# Patient Record
Sex: Female | Born: 1995 | Race: Black or African American | Hispanic: No | Marital: Single | Smoking: Never smoker
Health system: Southern US, Community
[De-identification: ages and names within clinical notes are randomized; demographics above are authoritative.]

---
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
## 2015-11-01 ENCOUNTER — Encounter (HOSPITAL_COMMUNITY): Payer: Self-pay | Admitting: Family Medicine

## 2015-11-01 ENCOUNTER — Emergency Department (HOSPITAL_COMMUNITY)
Admission: EM | Admit: 2015-11-01 | Discharge: 2015-11-01 | Disposition: A | Discharge status: Home / Self Care | Payer: 59 | Attending: Emergency Medicine | Admitting: Emergency Medicine

## 2015-11-01 ENCOUNTER — Emergency Department (HOSPITAL_COMMUNITY): Payer: 59

## 2015-11-01 DIAGNOSIS — R079 Chest pain, unspecified: Secondary | ICD-10-CM | POA: Diagnosis present

## 2015-11-01 DIAGNOSIS — R0602 Shortness of breath: Secondary | ICD-10-CM | POA: Insufficient documentation

## 2015-11-01 DIAGNOSIS — R0789 Other chest pain: Secondary | ICD-10-CM | POA: Insufficient documentation

## 2015-11-01 DIAGNOSIS — R011 Cardiac murmur, unspecified: Secondary | ICD-10-CM

## 2015-11-01 DIAGNOSIS — Z79899 Other long term (current) drug therapy: Secondary | ICD-10-CM | POA: Insufficient documentation

## 2015-11-01 LAB — BASIC METABOLIC PANEL
Anion gap: 10 (ref 5–15)
BUN: 6 mg/dL (ref 6–20)
CHLORIDE: 108 mmol/L (ref 101–111)
CO2: 23 mmol/L (ref 22–32)
CREATININE: 0.72 mg/dL (ref 0.44–1.00)
Calcium: 9 mg/dL (ref 8.9–10.3)
GFR calc non Af Amer: >60 mL/min (ref >60)
GLUCOSE: 98 mg/dL (ref 65–99)
Potassium: 3.5 mmol/L (ref 3.5–5.1)
Sodium: 141 mmol/L (ref 135–145)

## 2015-11-01 LAB — CBC
HCT: 37.1 % (ref 36.0–46.0)
Hemoglobin: 12.1 g/dL (ref 12.0–15.0)
MCH: 29.7 pg (ref 26.0–34.0)
MCHC: 32.6 g/dL (ref 30.0–36.0)
MCV: 91.2 fL (ref 78.0–100.0)
PLATELETS: 303 10*3/uL (ref 150–400)
RBC: 4.07 MIL/uL (ref 3.87–5.11)
RDW: 12.4 % (ref 11.5–15.5)
WBC: 5.5 10*3/uL (ref 4.0–10.5)

## 2015-11-01 LAB — I-STAT TROPONIN, ED
Troponin i, poc: 0 ng/mL (ref 0.00–0.08)
Troponin i, poc: 0 ng/mL (ref 0.00–0.08)

## 2015-11-01 NOTE — ED Notes (Signed)
Pt left with all her belongings and ambulated out of the treatment area.  

## 2015-11-01 NOTE — ED Notes (Signed)
Pt here for 2 weeks of intermittent chest pain that is burning. 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 sts nothing makes it worse. No heavy lifting. Denies cough, fever, sts some SOB,

## 2015-11-01 NOTE — Discharge Instructions (Signed)
Please call the cardiology clinic listed to schedule a follow-up appointment. Please let them know you were seen in the emergency department and it was recommended that you follow up with a cardiologist and get an echo as an outpatient. I would also advise you to find a primary physician in the area and follow up with them as well. Return to the ER for chest pain on exertion, difficulty breathing, new or worsening symptoms, any additional concerns.

## 2015-11-01 NOTE — ED Provider Notes (Signed)
CSN: [id number]     Arrival date & time 11/01/15  1541 History   First MD Initiated Contact with Patient 11/01/15 2053     Chief Complaint  Patient presents with  . Chest Pain     (Consider location/radiation/quality/duration/timing/severity/associated sxs/prior Treatment) Patient is a 20 y.o. female presenting with chest pain. The history is provided by the patient and medical records. No language interpreter was used.  Chest Pain Associated symptoms: shortness of breath   Associated symptoms: no abdominal pain, no back pain, no cough, no diaphoresis, no dizziness, no fever, no headache, no nausea, no palpitations and not vomiting    Kayla Leblanc is a 20 y.o. female  with no pertinent PMH who presents to the Emergency Department complaining of intermittent burning right-sided chest pain x 2 weeks. Patient states chest pain occurs without stimuli. Will occur "out of the blue" and is not related to exertion or eating. 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 Sometimes pain radiates into her stomach. Patient states during episodes she will sometimes experience shortness of breath, but other times will not. Denies fever, abdominal pain, n/v, back pain, increased activity level, injury or muscle strain, recent illness. At time of initial evaluation, chest pain has subsided and patient is asymptomatic.  Risk factors: Not a smoker, no cardiac history, no family cardiac history.  History reviewed. No pertinent past medical history. History reviewed. No pertinent past surgical history. History reviewed. No pertinent family history. Social History  Substance Use Topics  . Smoking status: Never Smoker   . Smokeless tobacco: None  . Alcohol Use: No   OB History    No data available     Review of Systems  Constitutional: Negative for fever, chills and diaphoresis.  HENT: Negative for congestion and sore throat.   Eyes: Negative for visual disturbance.  Respiratory: Positive for  shortness of breath. Negative for cough.   Cardiovascular: Positive for chest pain. Negative for palpitations and leg swelling.  Gastrointestinal: Negative for nausea, vomiting and abdominal pain.  Genitourinary: Negative for dysuria.  Musculoskeletal: Negative for back pain and neck pain.  Skin: Negative for rash.  Neurological: Negative for dizziness and headaches.      Allergies  Review of patient's allergies indicates no known allergies.  Home Medications   Prior to Admission medications   Medication Sig Start Date End Date Taking? Authorizing Provider  amoxicillin-clavulanate (AUGMENTIN) 500-125 MG per tablet Take 1 tablet (500 mg total) by mouth every 8 (eight) hours. 04/03/13   Marcellina Millin, MD  cephALEXin (KEFLEX) 500 MG capsule Take 1 capsule (500 mg total) by mouth 3 (three) times daily. 04/20/13   Reuben Likes, MD  mupirocin ointment (BACTROBAN) 2 % Apply 1 application topically 3 (three) times daily. 06/28/13   Reuben Likes, MD  phenazopyridine (PYRIDIUM) 200 MG tablet Take 1 tablet (200 mg total) by mouth 3 (three) times daily as needed for pain. 04/20/13   Reuben Likes, MD  sulfamethoxazole-trimethoprim (BACTRIM DS) 800-160 MG per tablet Take 2 tablets by mouth 2 (two) times daily. 06/28/13   Reuben Likes, MD   BP 124/86 mmHg  Pulse 60  Temp(Src) 98.3 F (36.8 C) (Oral)  Resp 19  SpO2 100%  LMP 10/25/2015 Physical Exam  Constitutional: She is oriented to person, place, and time. She appears well-developed and well-nourished.  Alert and in no acute distress  HENT:  Head: Normocephalic and atraumatic.  Cardiovascular: Normal rate, regular rhythm and intact distal pulses.  Exam reveals no gallop and no friction rub.   Murmur (Systolic 3/6) heard. Pulmonary/Chest: Effort normal and breath sounds normal. No respiratory distress. She has no wheezes. She has no rales. She exhibits tenderness.    Abdominal: Soft. Bowel sounds are normal. She exhibits no distension  and no mass. There is no tenderness. There is no rebound and no guarding.  Musculoskeletal: She exhibits no edema.  Neurological: She is alert and oriented to person, place, and time.  Skin: Skin is warm and dry.  Nursing note and vitals reviewed.   ED Course  Procedures (including critical care time) Labs Review Labs Reviewed  BASIC METABOLIC PANEL  CBC  I-STAT TROPOININ, ED  Rosezena SensorI-STAT TROPOININ, ED    Imaging Review Dg Chest 2 View  11/01/2015  CLINICAL DATA:  Chest pain for 2 weeks. EXAM: CHEST  2 VIEW COMPARISON:  None. FINDINGS: The heart size and mediastinal contours are within normal limits. Both lungs are clear. The visualized skeletal structures are unremarkable. IMPRESSION: No active cardiopulmonary disease. Electronically Signed   By: Elsie StainJohn T Curnes M.D.   On: 11/01/2015 16:33   I have personally reviewed and evaluated these images and lab results as part of my medical decision-making.   EKG Interpretation   Date/Time:  Wednesday November 01 2015 16:00:30 EDT Ventricular Rate:  71 PR Interval:  152 QRS Duration: 74 QT Interval:  392 QTC Calculation: 425 R Axis:   60 Text Interpretation:  Normal sinus rhythm with sinus arrhythmia Normal ECG  Confirmed by MESNER MD, Barbara CowerJASON [phone]([id number]) on 11/01/2015 9:57:15 PM      MDM   Final diagnoses:  Atypical chest pain  Heart murmur   Kayla Leblanc is a 20 y.o. female who presents to the ED for 2 weeks of intermittent chest pain described as burning. It is nonexertional. On exam, patient is tender to palpation over right side of chest which is where she states pain occurs. She also has a systolic heart murmur. Patient states she has never been told she has a heart murmur in the past. Upon my initial evaluation, patient states chest pain has resolved and she has been asymptomatic since arrival to the ED.  CBC, BMP, and troponin 2 obtained, reviewed, and within defined limits. Chest x-ray with no acute cardiopulmonary disease. EKG  reviewed and reassuring.  .Patient is to be discharged with recommendation to follow up with cardiology in regards to today's hospital visit. Given heart murmur on exam, most likely will need an echo done as an outpatient. His plan was discussed with patient who understands and agrees with the follow-up plan. Patient's symptoms unlikely to be of CAD etiology and patient has not reported any chest pain or shortness of breath while in my care. Labs and imaging reviewed again prior to dc. CXR and ECG with no acute abnormalities and neg troponins x2. Pt has been advised return to the ED if develop any exertional chest pain- strict return precautions discussed and all questions answered. Patient appears reliable for follow up and is agreeable to plan as dictated above.   Patient discussed with Dr. Clayborne DanaMesner who agrees with treatment plan.   [facility] HospitalJaime Pilcher Ward, PA-C 11/01/15 [id number]  Marily MemosJason Mesner, MD 11/04/15 1239

## 2016-09-12 ENCOUNTER — Encounter (HOSPITAL_COMMUNITY): Payer: Self-pay | Admitting: Emergency Medicine

## 2016-09-12 ENCOUNTER — Ambulatory Visit (HOSPITAL_COMMUNITY)
Admission: EM | Admit: 2016-09-12 | Discharge: 2016-09-12 | Disposition: A | Discharge status: Home / Self Care | Payer: 59 | Attending: Family Medicine | Admitting: Family Medicine

## 2016-09-12 DIAGNOSIS — S39012A Strain of muscle, fascia and tendon of lower back, initial encounter: Secondary | ICD-10-CM | POA: Diagnosis not present

## 2016-09-12 MED ORDER — CYCLOBENZAPRINE HCL 5 MG PO TABS: 5.0000 mg | ORAL_TABLET | Freq: Three times a day (TID) | ORAL | 0 refills | Status: DC | PRN

## 2016-09-12 MED ORDER — PREDNISONE 20 MG PO TABS: ORAL_TABLET | ORAL | 0 refills | Status: DC

## 2016-09-12 NOTE — ED Provider Notes (Signed)
[facility]    CSN: [id number] Arrival date & time: 09/12/16  1715     History   Chief Complaint Chief Complaint  Patient presents with  . Back Pain    HPI Kayla Leblanc is a 21 y.o. female.   This a 21 year old woman who comes in complaining of back pain that she's had for a week. She's had no injury or accident. She would like a work note.  Patient works for for [employer]. She likes to crack her back periodically. Her pain began after sitting for a while at work last Friday.      History reviewed. No pertinent past medical history.  There are no active problems to display for this patient.   History reviewed. No pertinent surgical history.  OB History    No data available       Home Medications    Prior to Admission medications   Medication Sig Start Date End Date Taking? Authorizing Provider  cyclobenzaprine (FLEXERIL) 5 MG tablet Take 1 tablet (5 mg total) by mouth 3 (three) times daily as needed for muscle spasms. 09/12/16   Elvina Sidle, MD  predniSONE (DELTASONE) 20 MG tablet Two daily with food 09/12/16   Elvina Sidle, MD    Family History History reviewed. No pertinent family history.  Social History Social History  Substance Use Topics  . Smoking status: Never Smoker  . Smokeless tobacco: Never Used  . Alcohol use No     Allergies   Patient has no known allergies.   Review of Systems Review of Systems  Constitutional: Negative.   Musculoskeletal: Positive for back pain.  Neurological: Negative.      Physical Exam Triage Vital Signs ED Triage Vitals [09/12/16 1755]  Enc Vitals Group     BP 96/57     Pulse Rate 81     Resp 16     Temp 98.3 F (36.8 C)     Temp Source Oral     SpO2 100 %     Weight      Height      Head Circumference      Peak Flow      Pain Score 7     Pain Loc      Pain Edu?      Excl. in GC?    No data found.   Updated Vital Signs BP 96/57 (BP Location: Right Arm)   Pulse 81    Temp 98.3 F (36.8 C) (Oral)   Resp 16   LMP 08/05/2016   SpO2 100%   Physical Exam  Constitutional: She is oriented to person, place, and time. She appears well-developed and well-nourished.  HENT:  Head: Normocephalic.  Right Ear: External ear normal.  Left Ear: External ear normal.  Mouth/Throat: Oropharynx is clear and moist.  Eyes: Conjunctivae and EOM are normal. Pupils are equal, round, and reactive to light.  Neck: Normal range of motion. Neck supple.  Abdominal: Soft.  Musculoskeletal: Normal range of motion.  Some tenderness in the low back in the midline just above the pelvis. This is accentuated when patient flexes her hips to bring her knees toward her chest.  Neurological: She is alert and oriented to person, place, and time.  Skin: Skin is warm and dry.  Nursing note and vitals reviewed.    UC Treatments / Results  Labs (all labs ordered are listed, but only abnormal results are displayed) Labs Reviewed - No data to display  EKG  EKG  Interpretation None       Radiology No results found.  Procedures Procedures (including critical care time)  Medications Ordered in UC Medications - No data to display   Initial Impression / Assessment and Plan / UC Course  I have reviewed the triage vital signs and the nursing notes.  Pertinent labs & imaging results that were available during my care of the patient were reviewed by me and considered in my medical decision making (see chart for details).      Final Clinical Impressions(s) / UC Diagnoses   Final diagnoses:  Strain of lumbar region, initial encounter    New Prescriptions New Prescriptions   CYCLOBENZAPRINE (FLEXERIL) 5 MG TABLET    Take 1 tablet (5 mg total) by mouth 3 (three) times daily as needed for muscle spasms.   PREDNISONE (DELTASONE) 20 MG TABLET    Two daily with food     Elvina SidleKurt Daziya Redmond, MD 09/12/16 1818

## 2016-09-12 NOTE — ED Triage Notes (Signed)
Here for lower back onset 7 days   Denies inj/trauma  Reports she is always sitting down on a chair  Pain increases w/activity  Needs note for work.   A&O x4... NAD

## 2017-05-29 ENCOUNTER — Emergency Department (HOSPITAL_COMMUNITY): Payer: 59

## 2017-05-29 ENCOUNTER — Encounter (HOSPITAL_COMMUNITY): Payer: Self-pay

## 2017-05-29 ENCOUNTER — Other Ambulatory Visit: Payer: Self-pay

## 2017-05-29 ENCOUNTER — Emergency Department (HOSPITAL_COMMUNITY)
Admission: EM | Admit: 2017-05-29 | Discharge: 2017-05-29 | Disposition: A | Discharge status: Home / Self Care | Payer: 59 | Attending: Emergency Medicine | Admitting: Emergency Medicine

## 2017-05-29 DIAGNOSIS — R51 Headache: Secondary | ICD-10-CM | POA: Diagnosis not present

## 2017-05-29 DIAGNOSIS — R519 Headache, unspecified: Secondary | ICD-10-CM

## 2017-05-29 DIAGNOSIS — R55 Syncope and collapse: Secondary | ICD-10-CM | POA: Diagnosis not present

## 2017-05-29 LAB — I-STAT CHEM 8, ED
BUN: 8 mg/dL (ref 6–20)
CALCIUM ION: 1.17 mmol/L (ref 1.15–1.40)
CREATININE: 0.7 mg/dL (ref 0.44–1.00)
Chloride: 104 mmol/L (ref 101–111)
GLUCOSE: 104 mg/dL — AB (ref 65–99)
HCT: 37 % (ref 36.0–46.0)
HEMOGLOBIN: 12.6 g/dL (ref 12.0–15.0)
Potassium: 3.4 mmol/L — ABNORMAL LOW (ref 3.5–5.1)
Sodium: 140 mmol/L (ref 135–145)
TCO2: 25 mmol/L (ref 22–32)

## 2017-05-29 LAB — ACETAMINOPHEN LEVEL

## 2017-05-29 LAB — RAPID URINE DRUG SCREEN, HOSP PERFORMED
Amphetamines: NOT DETECTED
BARBITURATES: NOT DETECTED
BENZODIAZEPINES: NOT DETECTED
Cocaine: NOT DETECTED
Opiates: NOT DETECTED
Tetrahydrocannabinol: NOT DETECTED

## 2017-05-29 LAB — I-STAT BETA HCG BLOOD, ED (MC, WL, AP ONLY): I-stat hCG, quantitative: <5 m[IU]/mL (ref <5)

## 2017-05-29 LAB — CBG MONITORING, ED: Glucose-Capillary: 99 mg/dL (ref 65–99)

## 2017-05-29 LAB — SALICYLATE LEVEL

## 2017-05-29 MED ORDER — METOCLOPRAMIDE HCL 5 MG/ML IJ SOLN
10.0000 mg | Freq: Once | INTRAMUSCULAR | Status: AC
  Administered 2017-05-29: 10 mg via INTRAVENOUS
  Filled 2017-05-29: qty 2

## 2017-05-29 MED ORDER — DIPHENHYDRAMINE HCL 50 MG/ML IJ SOLN
25.0000 mg | Freq: Once | INTRAMUSCULAR | Status: AC
  Administered 2017-05-29: 25 mg via INTRAVENOUS
  Filled 2017-05-29: qty 1

## 2017-05-29 MED ORDER — DEXAMETHASONE SODIUM PHOSPHATE 10 MG/ML IJ SOLN
10.0000 mg | Freq: Once | INTRAMUSCULAR | Status: AC
  Administered 2017-05-29: 10 mg via INTRAVENOUS
  Filled 2017-05-29: qty 1

## 2017-05-29 MED ORDER — SODIUM CHLORIDE 0.9 % IV BOLUS (SEPSIS)
1000.0000 mL | Freq: Once | INTRAVENOUS | Status: AC
  Administered 2017-05-29: 1000 mL via INTRAVENOUS

## 2017-05-29 MED ORDER — IOPAMIDOL (ISOVUE-370) INJECTION 76%
100.0000 mL | Freq: Once | INTRAVENOUS | Status: AC | PRN
  Administered 2017-05-29: 100 mL via INTRAVENOUS

## 2017-05-29 MED ORDER — MAGNESIUM SULFATE 2 GM/50ML IV SOLN
2.0000 g | INTRAVENOUS | Status: AC
  Administered 2017-05-29: 2 g via INTRAVENOUS
  Filled 2017-05-29: qty 50

## 2017-05-29 MED ORDER — IOPAMIDOL (ISOVUE-370) INJECTION 76%
INTRAVENOUS | Status: AC
Start: 2017-05-29 — End: 2017-05-29
  Administered 2017-05-29: 100 mL via INTRAVENOUS
  Filled 2017-05-29: qty 100

## 2017-05-29 MED ORDER — PROCHLORPERAZINE EDISYLATE 5 MG/ML IJ SOLN
10.0000 mg | Freq: Once | INTRAMUSCULAR | Status: AC
  Administered 2017-05-29: 10 mg via INTRAVENOUS
  Filled 2017-05-29: qty 2

## 2017-05-29 NOTE — ED Notes (Signed)
Patient transported to CT 

## 2017-05-29 NOTE — ED Notes (Signed)
Bed: WA09 Expected date:  Expected time:  Means of arrival:  Comments: 89f syncope

## 2017-05-29 NOTE — Discharge Instructions (Addendum)
Please read instructions below. You can take 600 mg of Advil/ibuprofen every 6 hours as needed for headache. You can alternate this with Tylenol. Schedule an appointment with Neurologist to follow up on your headache and discuss preventative treatment. Return to the ER for severely worsening headache, vision changes, fever, weakness or numbness, or new or concerning symptoms.

## 2017-05-29 NOTE — ED Triage Notes (Signed)
Patient brought in by EMS with a headache for 2 days. Patient had a syncopal episode at her friends house that lasted for 10-15 min according to friend. Friend states patient had complained of light headedness and when friend was out of the room she heard a "loud thump" and found the Patient on the floor. Friend states the patient was unresponsive while she was on the phone with first responders up until they arrived. Patient was alert and oriented according to EMS and responsive. Vitals were normal and EKG strip showed NSR. BP began trending down while with EMS. EMS reports a blood pressure of 89/60 when they decided to start an IV and began a bolus of NS. Patient is alert and responsive but sensitive to light.

## 2017-05-29 NOTE — ED Provider Notes (Signed)
Care assumed at shift change from [facility] H[facility], PA-C, pending CTA head results.  Patient presenting with the onset of headache prior to arrival.  Per Leblanc, no focal neuro deficits on exam.  CT head negative. Labs reassuring.  Patient's symptoms managed in ED.  CTA pending to rule out aneurysm versus bleed.  Plan for discharge if CT negative, with neurology referral for follow-up.  CTA neg. On re-eval, pt resting comfortably, tolerating PO fluids. Will discharge with neuro follow up. Pt is safe for discharge.  Discussed results, findings, treatment and follow up. Patient advised of return precautions. Patient verbalized understanding and agreed with plan.    Kayla Leblanc, [place] N, PA-C 05/29/17 [id number] [id number]    Palumbo, April, MD 05/30/17 [phone]

## 2017-05-29 NOTE — ED Provider Notes (Signed)
[facility]EMERGENCY DEPT Provider Note   CSN: [id number] Arrival date & time: 05/29/17  0100     History   Chief Complaint Chief Complaint  Patient presents with  . Migraine  . Loss of Consciousness    HPI Kayla Leblanc is a 21 y.o. female.  21 year old female presents to the emergency department for evaluation of syncope in the setting of headache.  She states that she began to notice an occipital headache this morning.  It is aching and throbbing in nature and has been persistent and worsening throughout the day.  She developed associated photophobia and began to feel lightheaded this evening.  No medications taken throughout the day for headache.  When at a friend's house, friend reports hearing a "loud thump" on the floor.  She returned to find the patient on the ground, unresponsive.  Patient regained consciousness with EMS.  She was reportedly given IVF en route for some transient hypotension.  Patient denies associated fever, blurry vision, vision loss, hearing changes, nausea, vomiting, extremity numbness or paresthesias, extremity weakness.  No prior history of migraines, though there is a family history of this.  No recent head injury or trauma.  Patient last ate at breakfast.   The history is provided by the patient. No language interpreter was used.  Migraine   Loss of Consciousness      No past medical history on file.  There are no active problems to display for this patient.   No past surgical history on file.  OB History    No data available       Home Medications    Prior to Admission medications   Not on File    Family History No family history on file.  Social History Social History   Tobacco Use  . Smoking status: Never Smoker  . Smokeless tobacco: Never Used  Substance Use Topics  . Alcohol use: No  . Drug use: No     Allergies   Patient has no known allergies.   Review of Systems Review of Systems    Cardiovascular: Positive for syncope.  Ten systems reviewed and are negative for acute change, except as noted in the HPI.    Physical Exam Updated Vital Signs BP (!) 96/56   Pulse 79   Resp 16   Ht 5\' 5"  (1.651 m)   Wt 68 kg (150 lb)   SpO2 99%   BMI 24.96 kg/m   Physical Exam  Constitutional: She is oriented to person, place, and time. She appears well-developed and well-nourished. No distress.  Nontoxic appearing and in no acute distress  HENT:  Head: Normocephalic and atraumatic.  Mouth/Throat: Oropharynx is clear and moist.  Symmetric rise of the uvula with phonation  Eyes: Conjunctivae and EOM are normal. Pupils are equal, round, and reactive to light. No scleral icterus.  Neck: Normal range of motion.  No nuchal rigidity or meningismus  Cardiovascular: Normal rate, regular rhythm and intact distal pulses.  Pulmonary/Chest: Effort normal. No stridor. No respiratory distress.  Respirations even and unlabored  Musculoskeletal: Normal range of motion.  Neurological: She is alert and oriented to person, place, and time. No cranial nerve deficit. She exhibits normal muscle tone. Coordination normal.  GCS 15. Speech is goal oriented. No cranial nerve deficits appreciated; symmetric eyebrow raise, no facial drooping, tongue midline. Patient has equal grip strength bilaterally with 5/5 strength against resistance in all major muscle groups bilaterally. Sensation to light touch intact. Patient moves extremities  without ataxia.  Skin: Skin is warm and dry. No rash noted. She is not diaphoretic. No erythema. No pallor.  Psychiatric: She has a normal mood and affect. Her behavior is normal.  Nursing note and vitals reviewed.    ED Treatments / Results  Labs (all labs ordered are listed, but only abnormal results are displayed) Labs Reviewed  ACETAMINOPHEN LEVEL - Abnormal; Notable for the following components:      Result Value   Acetaminophen (Tylenol), Serum <10 (*)    All  other components within normal limits  I-STAT CHEM 8, ED - Abnormal; Notable for the following components:   Potassium 3.4 (*)    Glucose, Bld 104 (*)    All other components within normal limits  RAPID URINE DRUG SCREEN, HOSP PERFORMED  SALICYLATE LEVEL  I-STAT BETA HCG BLOOD, ED ([facility], [facility], [facility] ONLY)  CBG MONITORING, ED    EKG  EKG Interpretation  Date/Time:  Thursday May 29 2017 01:24:46 EST Ventricular Rate:  85 PR Interval:    QRS Duration: 83 QT Interval:  397 QTC Calculation: 473 R Axis:   46 Text Interpretation:  Sinus rhythm Confirmed by Nicanor AlconPalumbo, April ([id number]) on 05/29/2017 3:18:59 AM       Radiology Ct Head Wo Contrast  Result Date: 05/29/2017 CLINICAL DATA:  Thunderclap headache EXAM: CT HEAD WITHOUT CONTRAST TECHNIQUE: Contiguous axial images were obtained from the base of the skull through the vertex without intravenous contrast. COMPARISON:  None. FINDINGS: Brain: No mass lesion, intraparenchymal hemorrhage or extra-axial collection. No evidence of acute cortical infarct. Brain parenchyma and CSF-containing spaces are normal for age. Vascular: No hyperdense vessel or unexpected calcification. Skull: Normal visualized skull base, calvarium and extracranial soft tissues. Sinuses/Orbits: No sinus fluid levels or advanced mucosal thickening. No mastoid effusion. Normal orbits. IMPRESSION: Normal head CT. Electronically Signed   By: Deatra RobinsonKevin  Herman M.D.   On: 05/29/2017 04:50    Procedures Procedures (including critical care time)  Medications Ordered in ED Medications  sodium chloride 0.9 % bolus 1,000 mL (0 mLs Intravenous Stopped 05/29/17 0537)  metoCLOPramide (REGLAN) injection 10 mg (10 mg Intravenous Given 05/29/17 0303)  dexamethasone (DECADRON) injection 10 mg (10 mg Intravenous Given 05/29/17 0528)  magnesium sulfate IVPB 2 g 50 mL (2 g Intravenous New Bag/Given 05/29/17 0530)  prochlorperazine (COMPAZINE) injection 10 mg (10 mg Intravenous Given 05/29/17 0529)    diphenhydrAMINE (BENADRYL) injection 25 mg (25 mg Intravenous Given 05/29/17 0527)     Initial Impression / Assessment and Plan / ED Course  I have reviewed the triage vital signs and the nursing notes.  Pertinent labs & imaging results that were available during my care of the patient were reviewed by me and considered in my medical decision making (see chart for details).     292:2650 AM 21 year old female presents to the emergency department for evaluation of atraumatic headache with subsequent syncope tonight.  Headache was gradual in onset.  No associated fevers or meningismus.  Patient with a nonfocal neurologic exam.  Will obtain labs and noncontrast CT.  Medications ordered for medical management of headache.  5:10 AM Patient reassessed.  Updated on reassuring labs and CT scan.  She reports that her pain has gone from 9/10 down to 7/10 with Reglan.  Will attempt additional medical management.  [facility].  Have discussed the potential for LP if no improvement in pain following IV medications.  Patient and family verbalized understanding.  6:45 AM Pain has improved to 1/10  with additional medications. Dr. Nicanor AlconPalumbo to assess.  6:52 AM Dr. Nicanor AlconPalumbo has seen and assessed the patient.  Despite improvement in pain, Dr. Nicanor AlconPalumbo recommends CTA of the head to r/o aneurysm.  Risks and benefits of additional radiologic study discussed with patient and family who consent to proceed with CTA.  If negative, I feel the patient is stable for outpatient follow up with Neurology.  Patient signed out to [place] Russo, PA-C at shift change who will follow up on imaging and disposition appropriately.   Final Clinical Impressions(s) / ED Diagnoses   Final diagnoses:  Bad headache    ED Discharge Orders    None       Antony MaduraHumes, Bradden Tadros, PA-C 05/29/17 [id number]    Palumbo, April, MD 05/29/17 [phone]

## 2018-02-19 ENCOUNTER — Other Ambulatory Visit: Payer: Self-pay

## 2018-02-19 ENCOUNTER — Encounter (HOSPITAL_COMMUNITY): Payer: Self-pay | Admitting: *Deleted

## 2018-02-19 ENCOUNTER — Ambulatory Visit (HOSPITAL_COMMUNITY)
Admission: EM | Admit: 2018-02-19 | Discharge: 2018-02-19 | Disposition: A | Discharge status: Home / Self Care | Payer: 59 | Attending: Family Medicine | Admitting: Family Medicine

## 2018-02-19 DIAGNOSIS — Z3202 Encounter for pregnancy test, result negative: Secondary | ICD-10-CM | POA: Diagnosis not present

## 2018-02-19 DIAGNOSIS — N925 Other specified irregular menstruation: Secondary | ICD-10-CM | POA: Diagnosis not present

## 2018-02-19 LAB — POCT PREGNANCY, URINE: Preg Test, Ur: NEGATIVE

## 2018-02-19 NOTE — ED Provider Notes (Signed)
[facility]    CSN: [id number] Arrival date & time: 02/19/18  1726     History   Chief Complaint Chief Complaint  Patient presents with  . Possible Pregnancy    HPI Kayla Leblanc is a 22 y.o. female.   Kayla Leblanc presents with requests for pregnancy test. States last period was beginning of June. Beginning of July had light spotting but never had complete period. Would be due anytime for period for month of august. States that was abnormal for her to have only spotting in July, therefore concerned for pregnancy. Took two tests a few weeks ago which were negative. She is sexually active and does not use condoms or birth control. No cramping or abdominal pain. Denies any change in diet or exercises regimen, denies increased stress. Stopped birth control approximately 1 year ago. Without contributing medical history.     ROS per HPI.      History reviewed. No pertinent past medical history.  There are no active problems to display for this patient.   History reviewed. No pertinent surgical history.  OB History   None      Home Medications    Prior to Admission medications   Not on File    Family History History reviewed. No pertinent family history.  Social History Social History   Tobacco Use  . Smoking status: Never Smoker  . Smokeless tobacco: Never Used  Substance Use Topics  . Alcohol use: No  . Drug use: No     Allergies   Patient has no known allergies.   Review of Systems Review of Systems   Physical Exam Triage Vital Signs ED Triage Vitals  Enc Vitals Group     BP 02/19/18 1746 125/81     Pulse Rate 02/19/18 1746 77     Resp 02/19/18 1746 18     Temp 02/19/18 1746 98.2 F (36.8 C)     Temp Source 02/19/18 1746 Oral     SpO2 02/19/18 1746 98 %     Weight --      Height --      Head Circumference --      Peak Flow --      Pain Score 02/19/18 1749 5     Pain Loc --      Pain Edu? --      Excl. in GC? --    No data  found.  Updated Vital Signs BP 125/81 (BP Location: Left Arm)   Pulse 77   Temp 98.2 F (36.8 C) (Oral)   Resp 18   LMP 12/21/2017 (Approximate)   SpO2 98%   Visual Acuity Right Eye Distance:   Left Eye Distance:   Bilateral Distance:    Right Eye Near:   Left Eye Near:    Bilateral Near:     Physical Exam  Constitutional: She is oriented to person, place, and time. She appears well-developed and well-nourished. No distress.  Cardiovascular: Normal rate, regular rhythm and normal heart sounds.  Pulmonary/Chest: Effort normal and breath sounds normal.  Abdominal: Soft. There is no tenderness.  Neurological: She is alert and oriented to person, place, and time.  Skin: Skin is warm and dry.     UC Treatments / Results  Labs (all labs ordered are listed, but only abnormal results are displayed) Labs Reviewed  POCT PREGNANCY, URINE    EKG None  Radiology No results found.  Procedures Procedures (including critical care time)  Medications Ordered in UC Medications -  No data to display  Initial Impression / Assessment and Plan / UC Course  I have reviewed the triage vital signs and the nursing notes.  Pertinent labs & imaging results that were available during my care of the patient were reviewed by me and considered in my medical decision making (see chart for details).     Negative pregnancy here today. Would expect august period any time at this point. Encouraged follow up PCP and/or gyn as needed for further evaluation of irregular periods. Encouraged condom use if preventing pregnancy. Patient verbalized understanding and agreeable to plan.   Final Clinical Impressions(s) / UC Diagnoses   Final diagnoses:  Encounter for pregnancy test with result negative     Discharge Instructions     Negative for pregnancy here today.  Please follow up with a primary care provider and/or gynecologist for further evaluation especially if continue to have irregular  periods.    ED Prescriptions    None     Controlled Substance Prescriptions [place] Controlled Substance Registry consulted? Not Applicable   Georgetta Haber, NP 02/19/18 1805

## 2018-02-19 NOTE — ED Triage Notes (Signed)
States her last menstrual period was June, in July was only spotting . Used in home preg. Test x 2 one was neg and the other one was undermined.

## 2018-02-19 NOTE — Discharge Instructions (Signed)
Negative for pregnancy here today.  Please follow up with a primary care provider and/or gynecologist for further evaluation especially if continue to have irregular periods.

## 2018-06-26 IMAGING — CT CT HEAD W/O CM
3 of 4 series · 15 of 47 positions shown, 18 images · non-contrast
Comparison: None.

CLINICAL DATA: Thunderclap headache

EXAM:
CT HEAD WITHOUT CONTRAST
TECHNIQUE: Contiguous axial images were obtained from the base of the skull
through the vertex without intravenous contrast.

[Series 2: head w/o · axial · non-contrast · 0.45mm/px · z∈[-176,-46]mm · 9 of 32 slices shown, 12 images]
[im 3/32  brain]
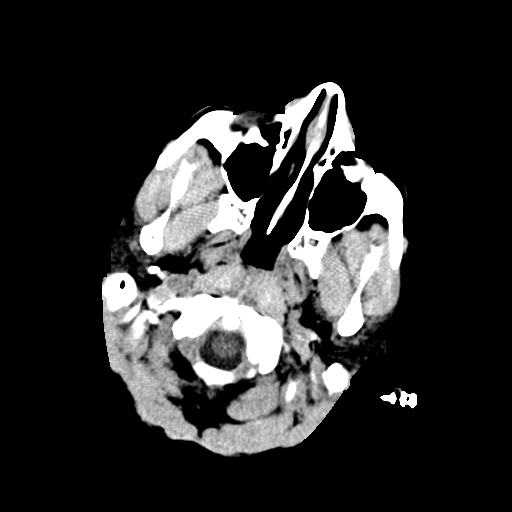
[im 3/32  bone]
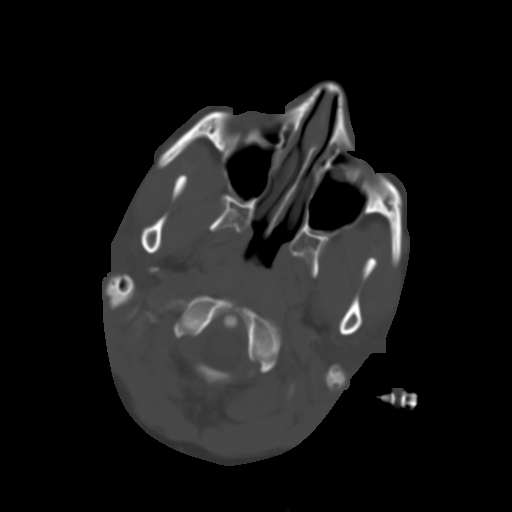
[im 7/32  brain]
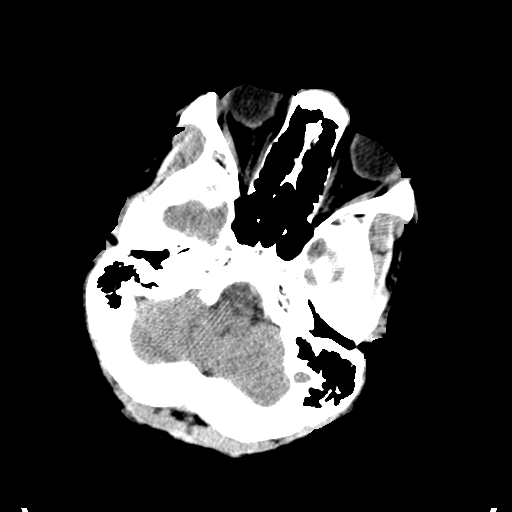
[im 9/32  brain]
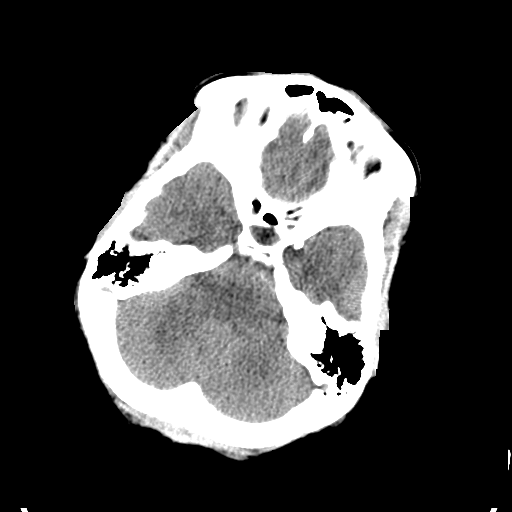
[im 14/32  brain]
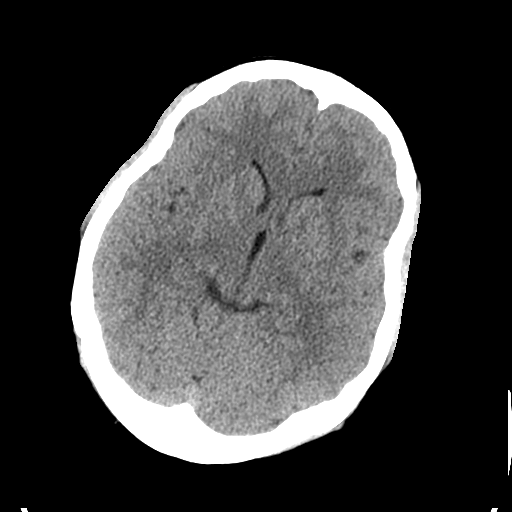
[im 16/32  brain]
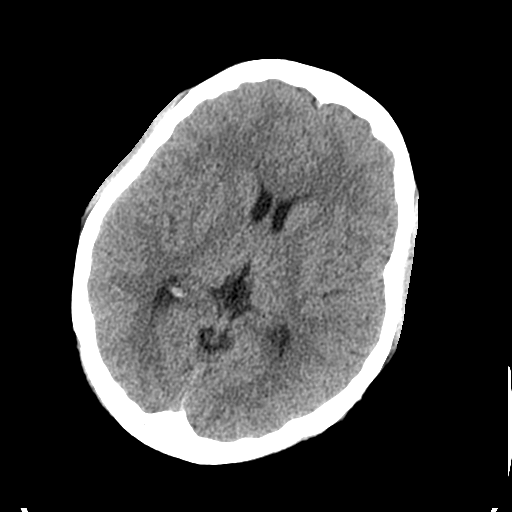
[im 16/32  bone]
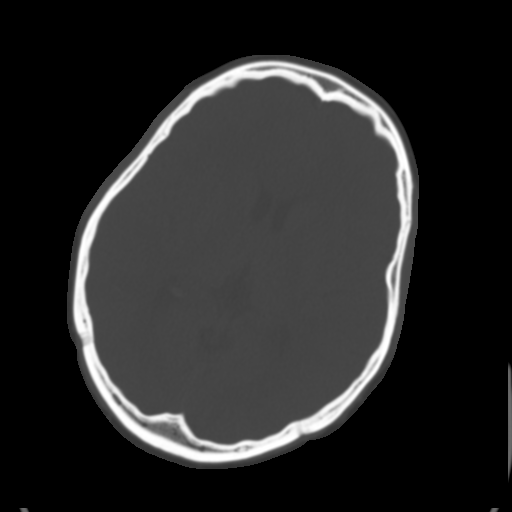
[im 18/32  brain]
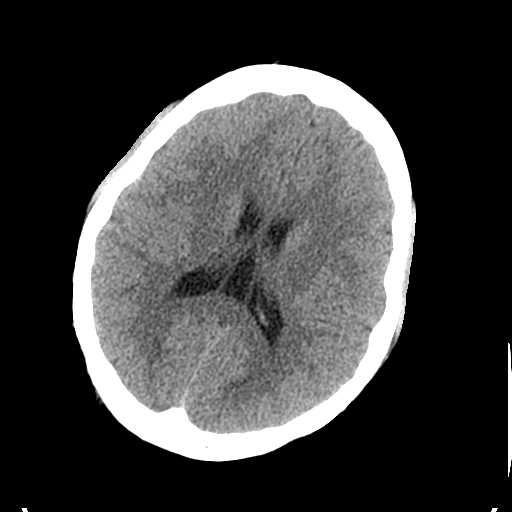
[im 23/32  brain]
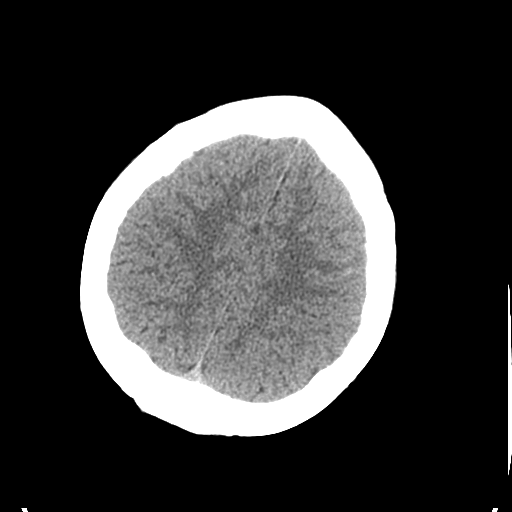
[im 25/32  brain]
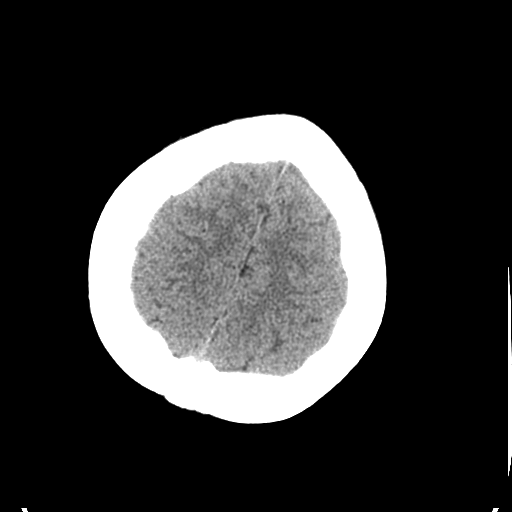
[im 29/32  brain]
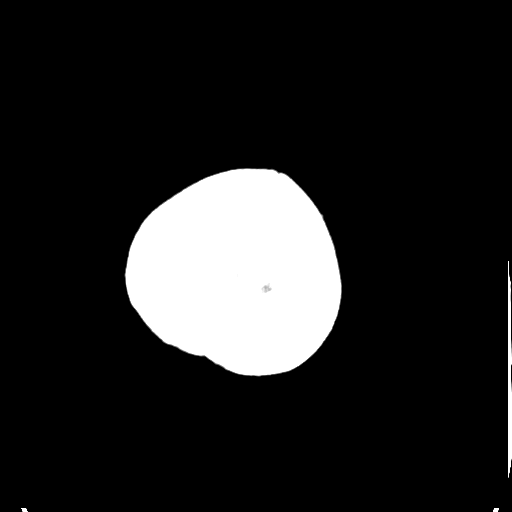
[im 29/32  bone]
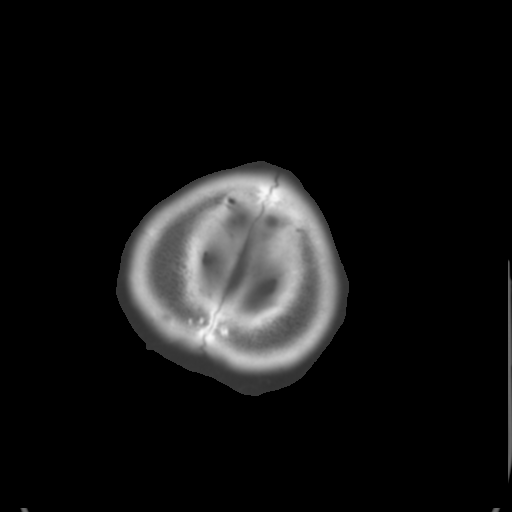

[Series 4: coronal · coronal · 0.30mm/px · 3 of 64 slices shown]
[im 22/64  brain]
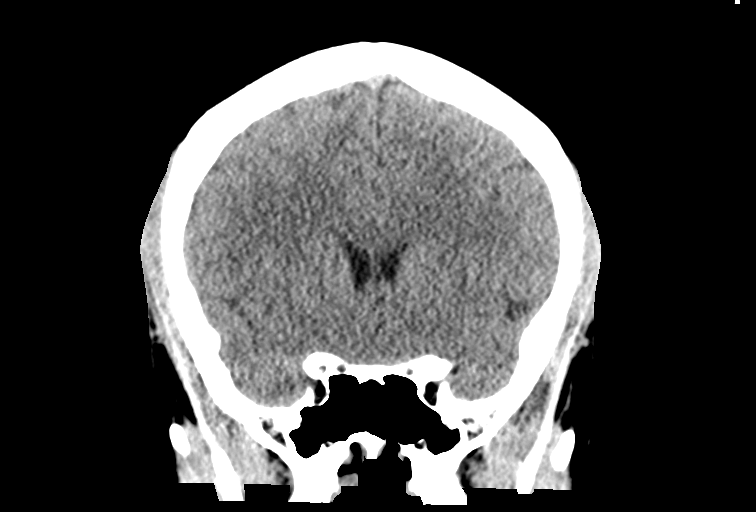
[im 29/64  brain]
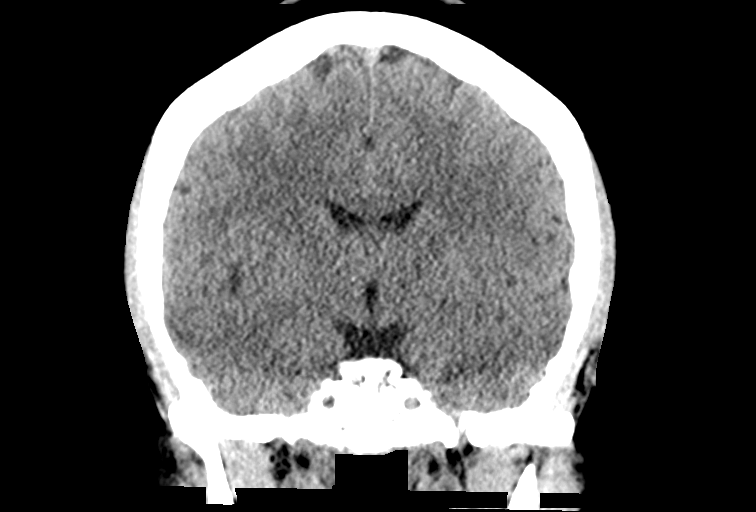
[im 36/64  brain]
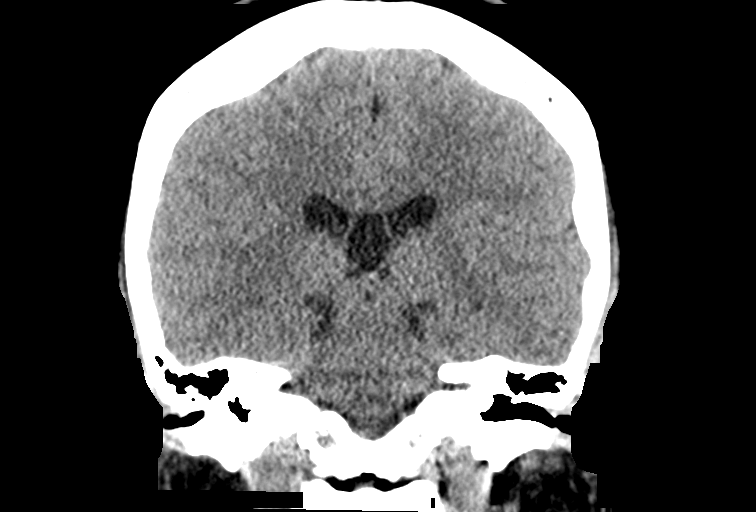

[Series 5: sagittal · sagittal · 0.31mm/px · 3 of 50 slices shown]
[im 17/50  brain]
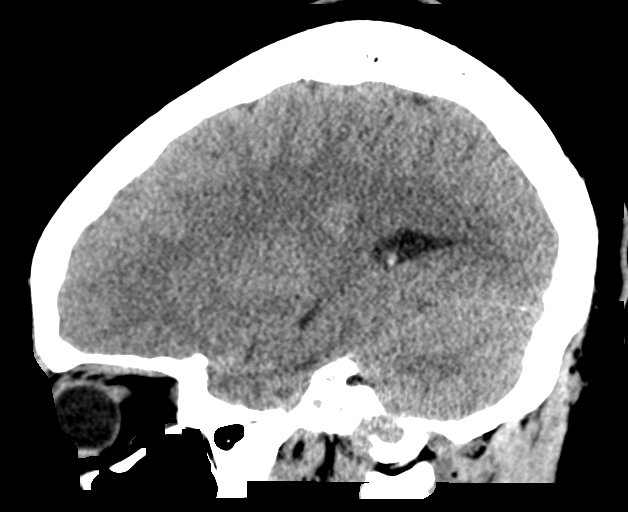
[im 25/50  brain]
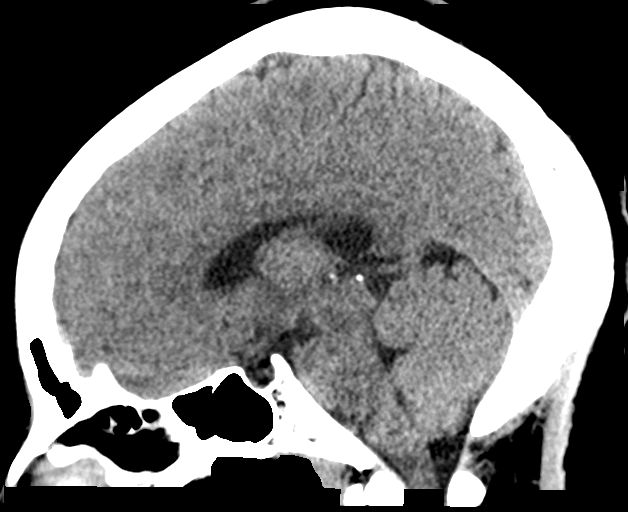
[im 33/50  brain]
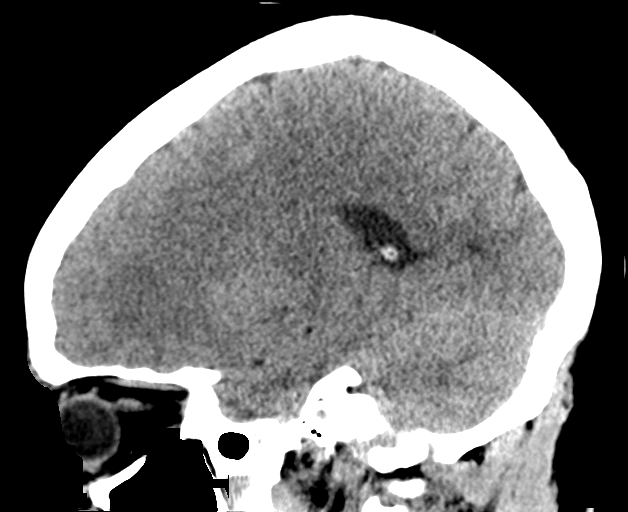

[15 of 47 positions shown; findings below may reference images not displayed]

FINDINGS: Brain: No mass lesion, intraparenchymal hemorrhage or extra-axial
collection. No evidence of acute cortical infarct. Brain parenchyma
and CSF-containing spaces are normal for age.

Vascular: No hyperdense vessel or unexpected calcification.

Skull: Normal visualized skull base, calvarium and extracranial soft
tissues.

Sinuses/Orbits: No sinus fluid levels or advanced mucosal
thickening. No mastoid effusion. Normal orbits.
IMPRESSION: Normal head CT.

## 2018-06-26 IMAGING — CT CT ANGIO HEAD
2 of 8 series · 7 of 33 positions shown · IV contrast (ISOVUE 370)
Comparison: CT head 05/29/2017

CLINICAL DATA: Worst headache of life

EXAM:
CT ANGIOGRAPHY HEAD
TECHNIQUE: Multidetector CT imaging of the head was performed using the
standard protocol during bolus administration of intravenous
contrast. Multiplanar CT image reconstructions and MIPs were
obtained to evaluate the vascular anatomy.
CONTRAST:  100 mL Isovue 370 IV

[Series 5: head/neck · axial · 0.41mm/px · z∈[-101,-47]mm · 2 of 81 slices shown]
[im 27/81  soft-tissue]
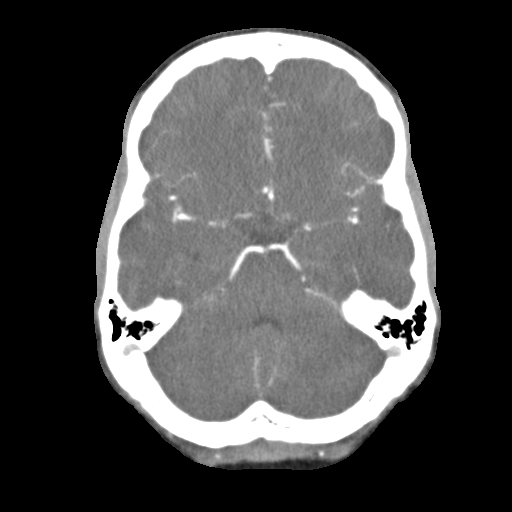
[im 54/81  soft-tissue]
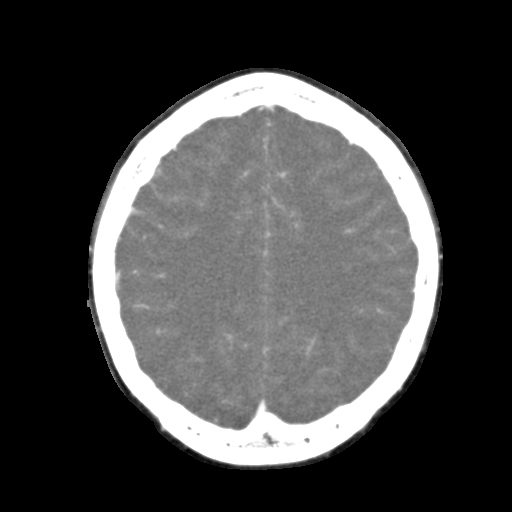

[Series 6: axial thin · axial · 0.31mm/px · z∈[-126,-20]mm · 5 of 160 slices shown]
[im 27/160  soft-tissue]
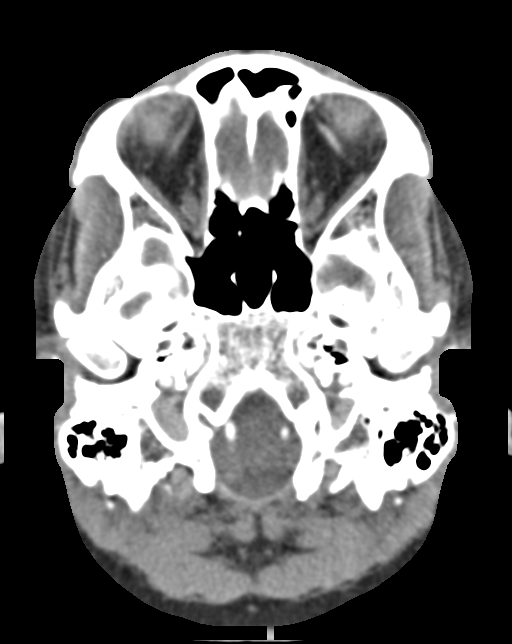
[im 54/160  bone]
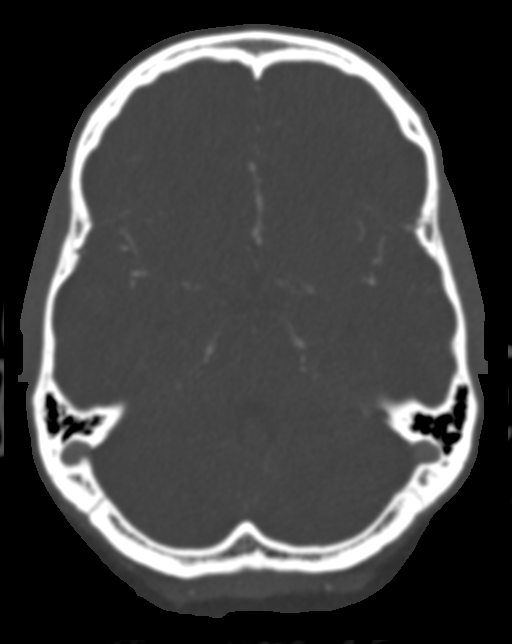
[im 80/160  soft-tissue]
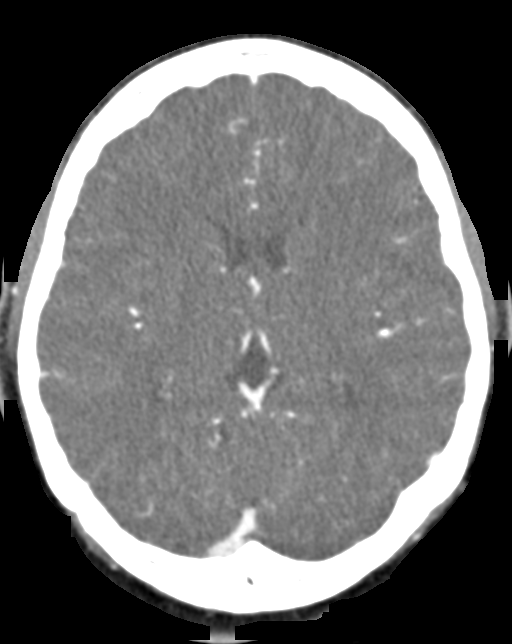
[im 107/160  bone]
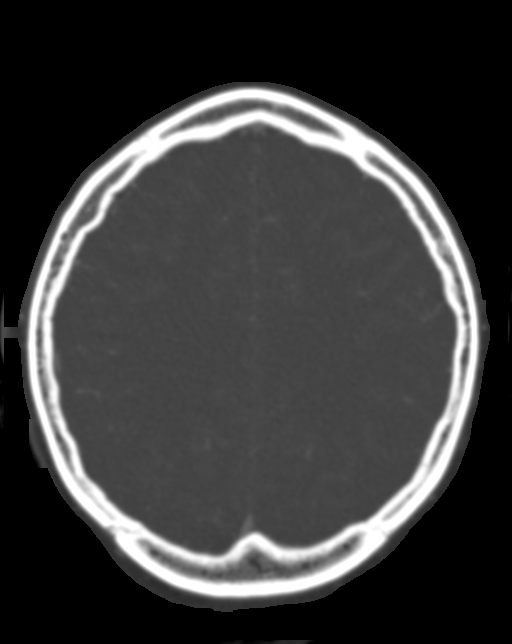
[im 133/160  soft-tissue]
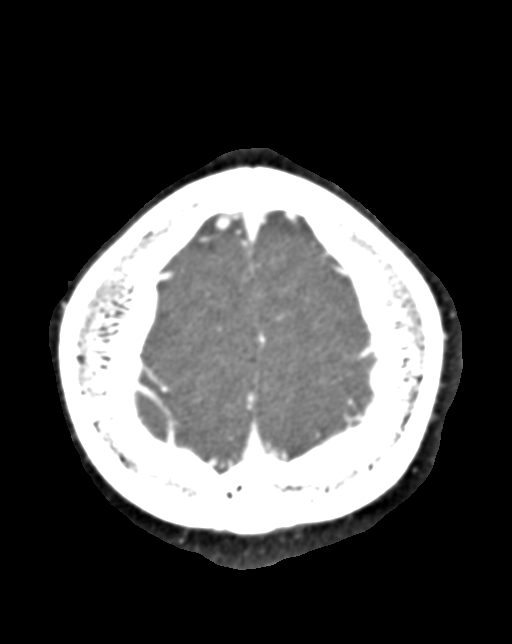

[7 of 33 positions shown; findings below may reference images not displayed]

FINDINGS: New

CTA HEAD

Anterior circulation: Cavernous carotid is normal bilaterally
without stenosis or aneurysm. No significant vascular calcification.
Anterior and middle cerebral artery is normal bilaterally. No
irregularity, stenosis, or aneurysm.

Posterior circulation: Both vertebral artery is are widely patent to
the basilar and appear normal. Basilar normal. PICA, superior
cerebellar, posterior cerebral artery is normal. Negative for
stenosis, irregularity, or aneurysm.

Venous sinuses: Patent

Anatomic variants: None

Delayed phase: Normal enhancement.
IMPRESSION: Normal CTA head

## 2019-10-11 ENCOUNTER — Other Ambulatory Visit: Payer: Self-pay

## 2019-10-11 ENCOUNTER — Encounter (HOSPITAL_COMMUNITY): Payer: Self-pay | Admitting: *Deleted

## 2019-10-11 ENCOUNTER — Emergency Department (HOSPITAL_COMMUNITY)
Admission: EM | Admit: 2019-10-11 | Discharge: 2019-10-12 | Disposition: A | Discharge status: Home / Self Care | Payer: Self-pay | Attending: Emergency Medicine | Admitting: Emergency Medicine

## 2019-10-11 DIAGNOSIS — R5383 Other fatigue: Secondary | ICD-10-CM | POA: Insufficient documentation

## 2019-10-11 DIAGNOSIS — N939 Abnormal uterine and vaginal bleeding, unspecified: Secondary | ICD-10-CM | POA: Insufficient documentation

## 2019-10-11 DIAGNOSIS — R110 Nausea: Secondary | ICD-10-CM | POA: Insufficient documentation

## 2019-10-11 DIAGNOSIS — Z5321 Procedure and treatment not carried out due to patient leaving prior to being seen by health care provider: Secondary | ICD-10-CM | POA: Insufficient documentation

## 2019-10-11 LAB — COMPREHENSIVE METABOLIC PANEL
ALT: 13 U/L (ref 0–44)
AST: 18 U/L (ref 15–41)
Albumin: 3.9 g/dL (ref 3.5–5.0)
Alkaline Phosphatase: 58 U/L (ref 38–126)
Anion gap: 7 (ref 5–15)
BUN: 6 mg/dL (ref 6–20)
CO2: 24 mmol/L (ref 22–32)
Calcium: 8.9 mg/dL (ref 8.9–10.3)
Chloride: 107 mmol/L (ref 98–111)
Creatinine, Ser: 0.66 mg/dL (ref 0.44–1.00)
GFR calc Af Amer: >60 mL/min (ref >60)
GFR calc non Af Amer: >60 mL/min (ref >60)
Glucose, Bld: 100 mg/dL — ABNORMAL HIGH (ref 70–99)
Potassium: 4.3 mmol/L (ref 3.5–5.1)
Sodium: 138 mmol/L (ref 135–145)
Total Bilirubin: 0.9 mg/dL (ref 0.3–1.2)
Total Protein: 6.9 g/dL (ref 6.5–8.1)

## 2019-10-11 LAB — CBC
HCT: 40.6 % (ref 36.0–46.0)
Hemoglobin: 12.9 g/dL (ref 12.0–15.0)
MCH: 30.3 pg (ref 26.0–34.0)
MCHC: 31.8 g/dL (ref 30.0–36.0)
MCV: 95.3 fL (ref 80.0–100.0)
Platelets: 283 10*3/uL (ref 150–400)
RBC: 4.26 MIL/uL (ref 3.87–5.11)
RDW: 12.4 % (ref 11.5–15.5)
WBC: 4.8 10*3/uL (ref 4.0–10.5)
nRBC: 0 % (ref 0.0–0.2)

## 2019-10-11 LAB — I-STAT BETA HCG BLOOD, ED (MC, WL, AP ONLY): I-stat hCG, quantitative: <5 m[IU]/mL (ref <5)

## 2019-10-11 LAB — URINALYSIS, ROUTINE W REFLEX MICROSCOPIC
Bilirubin Urine: NEGATIVE
Glucose, UA: NEGATIVE mg/dL
Hgb urine dipstick: NEGATIVE
Ketones, ur: NEGATIVE mg/dL
Nitrite: NEGATIVE
Protein, ur: 30 mg/dL — AB
Specific Gravity, Urine: 1.025 (ref 1.005–1.030)
pH: 7 (ref 5.0–8.0)

## 2019-10-11 LAB — LIPASE, BLOOD: Lipase: 30 U/L (ref 11–51)

## 2019-10-11 MED ORDER — SODIUM CHLORIDE 0.9% FLUSH: 3.0000 mL | Freq: Once | INTRAVENOUS | Status: DC

## 2019-10-11 NOTE — ED Triage Notes (Signed)
Pt reports having irregular and light menstrual cycle x 2 months. Took a preg test that was neg but continues to have fatigue and nausea.

## 2021-03-20 ENCOUNTER — Emergency Department (HOSPITAL_COMMUNITY)
Admission: EM | Admit: 2021-03-20 | Discharge: 2021-03-20 | Disposition: A | Discharge status: Home / Self Care | Payer: No Typology Code available for payment source | Attending: Student | Admitting: Student

## 2021-03-20 ENCOUNTER — Other Ambulatory Visit: Payer: Self-pay

## 2021-03-20 DIAGNOSIS — N9489 Other specified conditions associated with female genital organs and menstrual cycle: Secondary | ICD-10-CM | POA: Diagnosis not present

## 2021-03-20 DIAGNOSIS — N939 Abnormal uterine and vaginal bleeding, unspecified: Secondary | ICD-10-CM | POA: Diagnosis present

## 2021-03-20 LAB — URINALYSIS, ROUTINE W REFLEX MICROSCOPIC
Bacteria, UA: NONE SEEN
Bilirubin Urine: NEGATIVE
Glucose, UA: NEGATIVE mg/dL
Ketones, ur: 5 mg/dL — AB
Leukocytes,Ua: NEGATIVE
Nitrite: NEGATIVE
Protein, ur: 100 mg/dL — AB
RBC / HPF: >50 RBC/hpf — ABNORMAL HIGH (ref 0–5)
Specific Gravity, Urine: 1.024 (ref 1.005–1.030)
pH: 5 (ref 5.0–8.0)

## 2021-03-20 LAB — CBC
HCT: 36 % (ref 36.0–46.0)
Hemoglobin: 11.2 g/dL — ABNORMAL LOW (ref 12.0–15.0)
MCH: 27.9 pg (ref 26.0–34.0)
MCHC: 31.1 g/dL (ref 30.0–36.0)
MCV: 89.6 fL (ref 80.0–100.0)
Platelets: 287 10*3/uL (ref 150–400)
RBC: 4.02 MIL/uL (ref 3.87–5.11)
RDW: 13.3 % (ref 11.5–15.5)
WBC: 4.8 10*3/uL (ref 4.0–10.5)
nRBC: 0 % (ref 0.0–0.2)

## 2021-03-20 LAB — COMPREHENSIVE METABOLIC PANEL
ALT: 13 U/L (ref 0–44)
AST: 18 U/L (ref 15–41)
Albumin: 4.5 g/dL (ref 3.5–5.0)
Alkaline Phosphatase: 57 U/L (ref 38–126)
Anion gap: 8 (ref 5–15)
BUN: 11 mg/dL (ref 6–20)
CO2: 23 mmol/L (ref 22–32)
Calcium: 9.5 mg/dL (ref 8.9–10.3)
Chloride: 111 mmol/L (ref 98–111)
Creatinine, Ser: 0.84 mg/dL (ref 0.44–1.00)
GFR, Estimated: >60 mL/min (ref >60)
Glucose, Bld: 80 mg/dL (ref 70–99)
Potassium: 3.5 mmol/L (ref 3.5–5.1)
Sodium: 142 mmol/L (ref 135–145)
Total Bilirubin: 0.8 mg/dL (ref 0.3–1.2)
Total Protein: 7.8 g/dL (ref 6.5–8.1)

## 2021-03-20 LAB — WET PREP, GENITAL
Sperm: NONE SEEN
Trich, Wet Prep: NONE SEEN
Yeast Wet Prep HPF POC: NONE SEEN

## 2021-03-20 LAB — RAPID HIV SCREEN (HIV 1/2 AB+AG)
HIV 1/2 Antibodies: NONREACTIVE
HIV-1 P24 Antigen - HIV24: NONREACTIVE

## 2021-03-20 LAB — I-STAT BETA HCG BLOOD, ED (MC, WL, AP ONLY): I-stat hCG, quantitative: <5 m[IU]/mL (ref <5)

## 2021-03-20 LAB — LIPASE, BLOOD: Lipase: 42 U/L (ref 11–51)

## 2021-03-20 MED ORDER — IBUPROFEN 200 MG PO TABS
600.0000 mg | ORAL_TABLET | Freq: Once | ORAL | Status: AC
  Administered 2021-03-20: 600 mg via ORAL
  Filled 2021-03-20: qty 3

## 2021-03-20 MED ORDER — IBUPROFEN 600 MG PO TABS: 600.0000 mg | ORAL_TABLET | Freq: Four times a day (QID) | ORAL | 0 refills | Status: AC | PRN

## 2021-03-20 NOTE — ED Notes (Addendum)
Pelvic setup and ready 

## 2021-03-20 NOTE — ED Provider Notes (Signed)
[facility]EMERGENCY DEPT Provider Note   CSN: [id number] Arrival date & time: 03/20/21  1633     History Chief Complaint  Patient presents with   Abdominal Pain   Vaginal Bleeding    Kayla Leblanc is a 25 y.o. female who presents the emergency department for evaluation of abdominal pain and vaginal bleeding.  She states that she was supposed to have her Nexplanon removed but she has had difficulty with her outpatient clinic and getting this removed.  She states that she has had a mild increase in her vaginal bleeding with associated dizziness.  She is also requesting STI testing at this time.   Abdominal Pain Associated symptoms: vaginal bleeding   Associated symptoms: no chest pain, no chills, no cough, no dysuria, no fever, no hematuria, no shortness of breath, no sore throat and no vomiting   Vaginal Bleeding Associated symptoms: abdominal pain   Associated symptoms: no back pain, no dysuria and no fever       No past medical history on file.  There are no problems to display for this patient.   No past surgical history on file.   OB History   No obstetric history on file.     No family history on file.  Social History   Tobacco Use   Smoking status: Never   Smokeless tobacco: Never  Substance Use Topics   Alcohol use: No   Drug use: No    Home Medications Prior to Admission medications   Medication Sig Start Date End Date Taking? Authorizing Provider  ibuprofen (ADVIL) 600 MG tablet Take 1 tablet (600 mg total) by mouth every 6 (six) hours as needed. 03/20/21  Yes Avedis Bevis, MD    Allergies    Patient has no known allergies.  Review of Systems   Review of Systems  Constitutional:  Negative for chills and fever.  HENT:  Negative for ear pain and sore throat.   Eyes:  Negative for pain and visual disturbance.  Respiratory:  Negative for cough and shortness of breath.   Cardiovascular:  Negative for chest pain and  palpitations.  Gastrointestinal:  Positive for abdominal pain. Negative for vomiting.  Genitourinary:  Positive for vaginal bleeding. Negative for dysuria and hematuria.  Musculoskeletal:  Negative for arthralgias and back pain.  Skin:  Negative for color change and rash.  Neurological:  Positive for light-headedness. Negative for seizures and syncope.  All other systems reviewed and are negative.  Physical Exam Updated Vital Signs BP 107/77 (BP Location: Left Arm)   Pulse 61   Temp 98.3 F (36.8 C) (Oral)   Resp 18   Ht 5\' 5"  (1.651 m)   Wt 74.4 kg   SpO2 100%   BMI 27.29 kg/m   Physical Exam Vitals and nursing note reviewed.  Constitutional:      General: She is not in acute distress.    Appearance: She is well-developed.  HENT:     Head: Normocephalic and atraumatic.  Eyes:     Conjunctiva/sclera: Conjunctivae normal.  Cardiovascular:     Rate and Rhythm: Normal rate and regular rhythm.     Heart sounds: No murmur heard. Pulmonary:     Effort: Pulmonary effort is normal. No respiratory distress.     Breath sounds: Normal breath sounds.  Abdominal:     Palpations: Abdomen is soft.     Tenderness: There is no abdominal tenderness.  Genitourinary:    Vagina: Normal.     Cervix: No  discharge or friability.     Comments: Mild blood volume in vaginal vault, no vaginal lacerations, no malignant lesions seen on cervix Musculoskeletal:     Cervical back: Neck supple.  Skin:    General: Skin is warm and dry.  Neurological:     Mental Status: She is alert.    ED Results / Procedures / Treatments   Labs (all labs ordered are listed, but only abnormal results are displayed) Labs Reviewed  WET PREP, GENITAL - Abnormal; Notable for the following components:      Result Value   Clue Cells Wet Prep HPF POC PRESENT (*)    WBC, Wet Prep HPF POC FEW (*)    All other components within normal limits  CBC - Abnormal; Notable for the following components:   Hemoglobin 11.2 (*)     All other components within normal limits  URINALYSIS, ROUTINE W REFLEX MICROSCOPIC - Abnormal; Notable for the following components:   APPearance HAZY (*)    Hgb urine dipstick LARGE (*)    Ketones, ur 5 (*)    Protein, ur 100 (*)    RBC / HPF >50 (*)    All other components within normal limits  LIPASE, BLOOD  COMPREHENSIVE METABOLIC PANEL  RAPID HIV SCREEN (HIV 1/2 AB+AG)  RPR  I-STAT BETA HCG BLOOD, ED ([facility], [facility], [facility] ONLY)  GC/CHLAMYDIA PROBE AMP ([place]) NOT AT [facility]    EKG None  Radiology No results found.  Procedures Procedures   Medications Ordered in ED Medications  ibuprofen (ADVIL) tablet 600 mg (600 mg Oral Given 03/20/21 2008)    ED Course  I have reviewed the triage vital signs and the nursing notes.  Pertinent labs & imaging results that were available during my care of the patient were reviewed by me and considered in my medical decision making (see chart for details).    MDM Rules/Calculators/A&P                           Patient seen the emergency department for evaluation of vaginal bleeding dizziness.  Physical exam largely unremarkable, pelvic exam with no vaginal lacerations, mild blood volume in the vaginal vault consistent with menstrual bleeding, no discharge or cervical friability.  Laboratory evaluation reveals a mild anemia to 11.2 but is otherwise unremarkable, HIV negative, wet prep with clue cells but the patient has no symptomatic discharge and we will defer Flagyl treatment at this time, urinalysis with greater than 50 red blood cells likely from patient's vaginal bleeding.  GC chlamydia and RPR are currently pending.  Patient was given ibuprofen for abnormal uterine bleeding and given resources for both OB/GYN establishment through the [facility] as well as resources on [facility] for Nexplanon removal.  On reassessment, patient is hemodynamically stable with symptoms improved and patient was discharged with a prescription  for ibuprofen. Final Clinical Impression(s) / ED Diagnoses Final diagnoses:  Vaginal bleeding    Rx / DC Orders ED Discharge Orders          Ordered    ibuprofen (ADVIL) 600 MG tablet  Every 6 hours PRN        03/20/21 2126             Glendora Score, MD 03/20/21 2358

## 2021-03-20 NOTE — ED Triage Notes (Signed)
Pt states she has nexplanon inserted in L arm and has been past due to get it taken out. Pt states she has been unsuccessful in getting tx at clinic for same. Pt states she has had increasingly heavy vaginal bleeding and mid abdominal pain for approximately two days.

## 2021-03-20 NOTE — ED Provider Notes (Signed)
Emergency Medicine Provider Triage Evaluation Note  Kayla Leblanc , a 25 y.o. female  was evaluated in triage.  Pt complains of heavy vaginal bleeding, abdominal pain, dizziness. She reports she was supposed to have her nexplanon removed over a year ago and is worried this is the cause.  Review of Systems  Positive: Vaginal bleeding, dizziness Negative: Nausea, vomiting  Physical Exam  BP 119/72 (BP Location: Right Arm)   Pulse 80   Temp 98.3 F (36.8 C) (Oral)   Resp 14   Ht 5\' 5"  (1.651 m)   Wt 74.4 kg   SpO2 92%   BMI 27.29 kg/m  Gen:   Awake, no distress   Resp:  Normal effort  MSK:   Moves extremities without difficulty  Other:  Abdominal tenderness  Medical Decision Making  Medically screening exam initiated at 5:07 PM.  Appropriate orders placed.  Kayla Leblanc was informed that the remainder of the evaluation will be completed by another provider, this initial triage assessment does not replace that evaluation, and the importance of remaining in the ED until their evaluation is complete.  Heavy vaginal bleeding   Dora Sims 03/20/21 1708    03/22/21, MD 03/21/21 2242

## 2021-03-21 ENCOUNTER — Telehealth (HOSPITAL_COMMUNITY): Payer: Self-pay | Admitting: Student

## 2021-03-21 LAB — GC/CHLAMYDIA PROBE AMP (~~LOC~~) NOT AT ARMC
Chlamydia: POSITIVE — AB
Comment: NEGATIVE
Comment: NORMAL
Neisseria Gonorrhea: NEGATIVE

## 2021-03-21 LAB — RPR: RPR Ser Ql: NONREACTIVE

## 2021-03-21 MED ORDER — DOXYCYCLINE HYCLATE 100 MG PO CAPS: 100.0000 mg | ORAL_CAPSULE | Freq: Two times a day (BID) | ORAL | 0 refills | Status: AC

## 2021-03-21 NOTE — Telephone Encounter (Signed)
Chlamydial test positive, called patient to inform of positive test and doxycycline sent to pharmacy. Ceftriaxone not explicitly required with negative gonorrhea screen, but pt encouraged to follow up at health department out of abundance of caution.
